# Patient Record
Sex: Male | Born: 1965
Health system: Southern US, Community
[De-identification: ages and names within clinical notes are randomized; demographics above are authoritative.]

## PROBLEM LIST (undated history)

## (undated) DIAGNOSIS — G4730 Sleep apnea, unspecified: Secondary | ICD-10-CM

## (undated) DIAGNOSIS — K4090 Unilateral inguinal hernia, without obstruction or gangrene, not specified as recurrent: Secondary | ICD-10-CM

## (undated) DIAGNOSIS — J302 Other seasonal allergic rhinitis: Secondary | ICD-10-CM

## (undated) DIAGNOSIS — Z9889 Other specified postprocedural states: Secondary | ICD-10-CM

## (undated) DIAGNOSIS — R112 Nausea with vomiting, unspecified: Secondary | ICD-10-CM

---
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
## 2003-12-06 HISTORY — PX: OTHER SURGICAL HISTORY: SHX169

## 2007-01-09 ENCOUNTER — Encounter: Admission: RE | Admit: 2007-01-09 | Discharge: 2007-01-09 | Payer: Self-pay | Admitting: Otolaryngology

## 2008-07-20 IMAGING — CT CT NECK W/ CM
3 of 4 series · 11 of 20 positions shown, 12 images · IV contrast ([ID] OMNI 300)
Comparison: none

CLINICAL DATA: Possible right-sided abnormality.  Area of question marked with a BB.  
NECK CT WITH CONTRAST:
TECHNIQUE: Multidetector CT imaging of the neck was performed following the standard protocol during administration of intravenous contrast.
Contrast:  100 cc Omnipaque 300

[Series 2: neck w/ · axial · 0.39mm/px · z∈[+28,+186]mm · 4 of 71 slices shown, 5 images]
[im 15/71  soft-tissue]
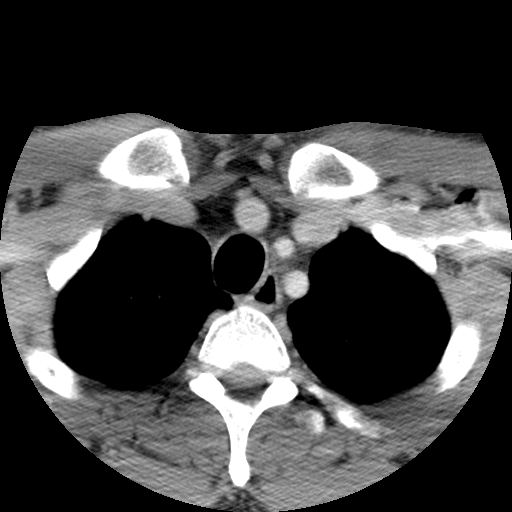
[im 15/71  bone]
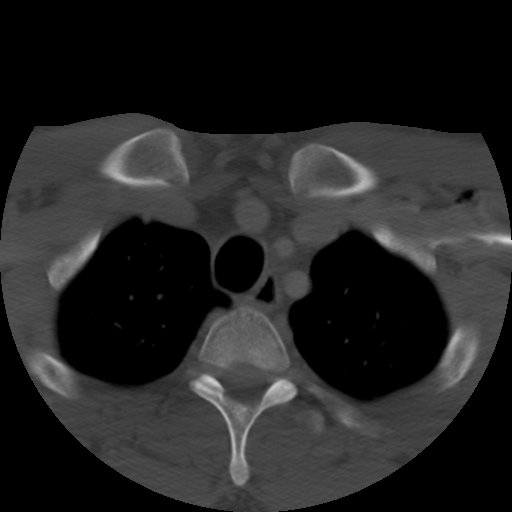
[im 29/71  bone]
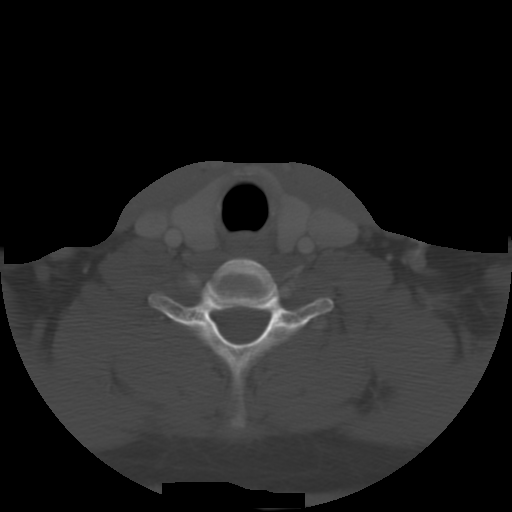
[im 43/71  bone]
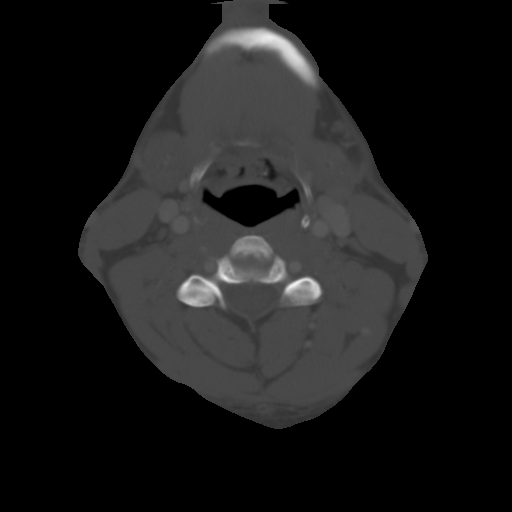
[im 57/71  bone]
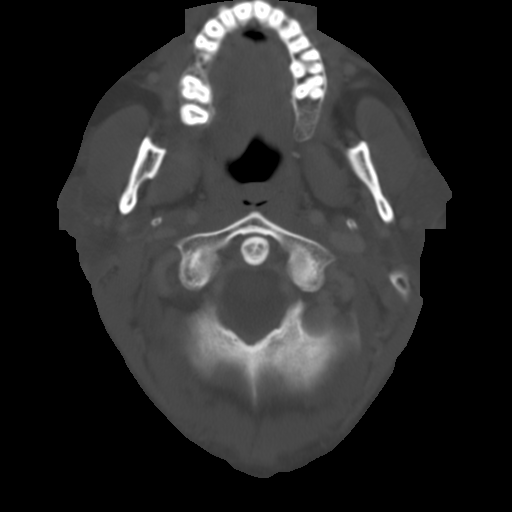

[Series 400: sagittal · sagittal · 0.53mm/px · 4 of 69 slices shown]
[im 14/69  bone]
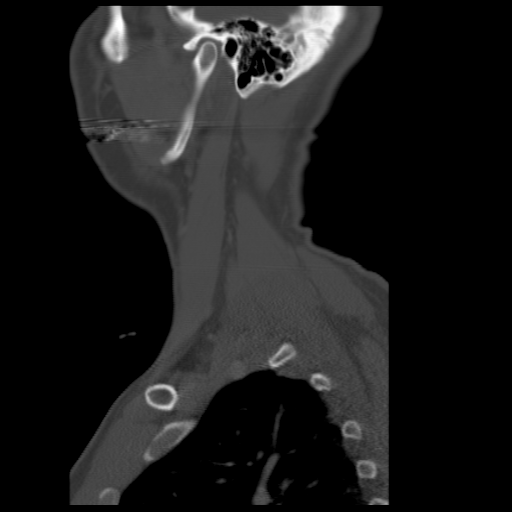
[im 28/69  bone]
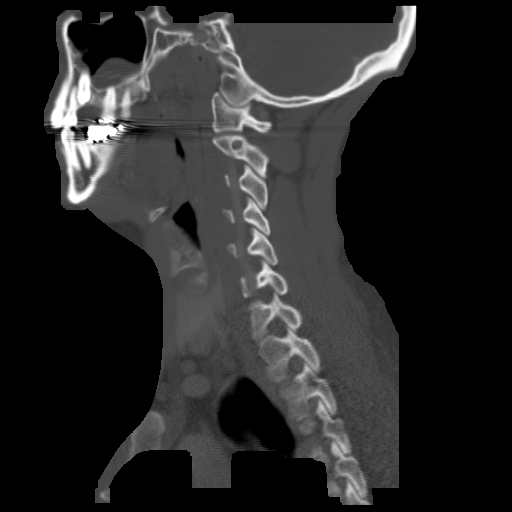
[im 41/69  bone]
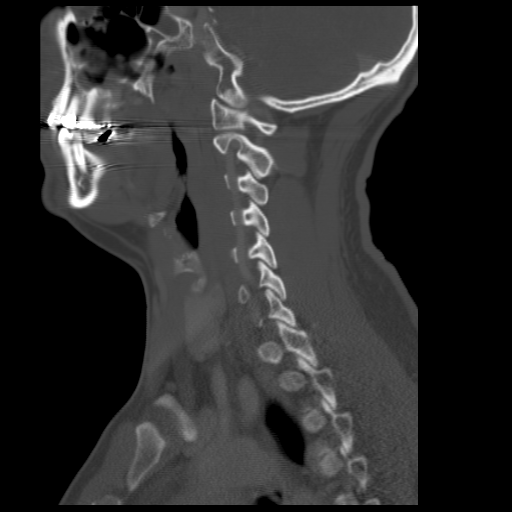
[im 55/69  bone]
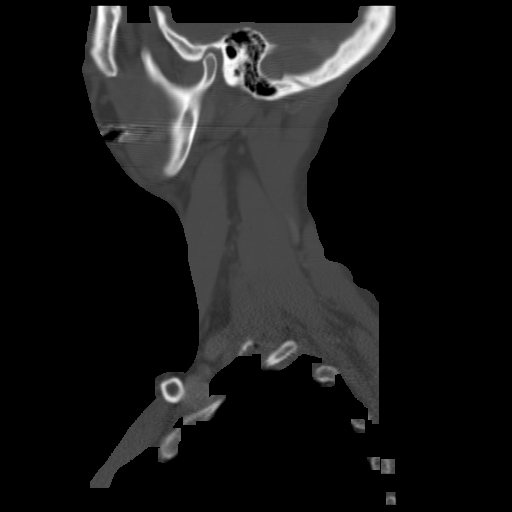

[Series 401: coronal · coronal · 0.53mm/px · 3 of 69 slices shown]
[im 14/69  bone]
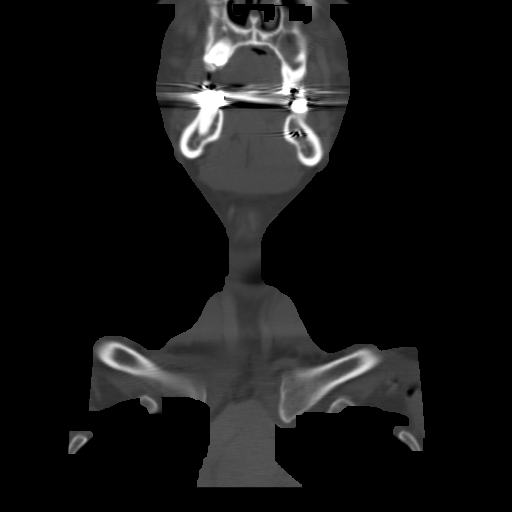
[im 28/69  bone]
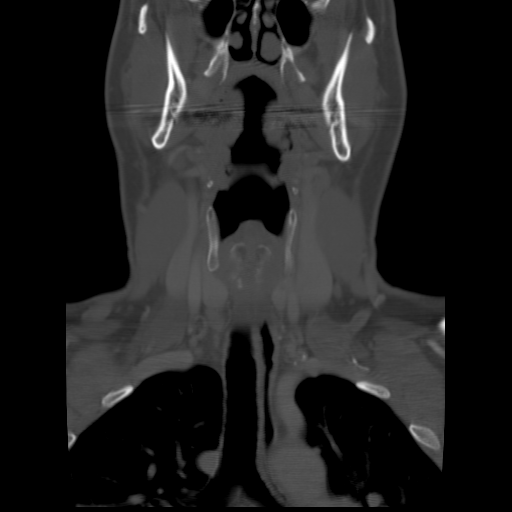
[im 41/69  bone]
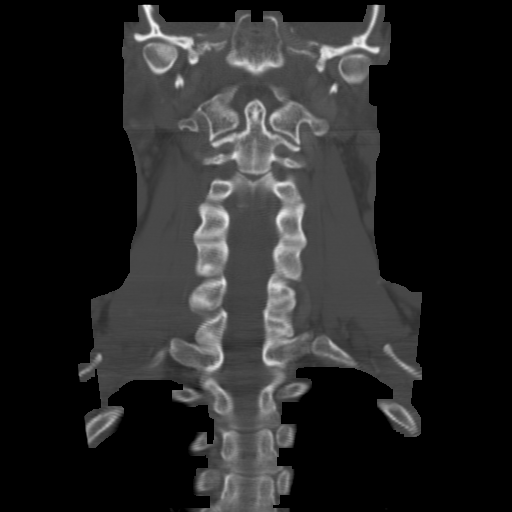

[11 of 20 positions shown; findings below may reference images not displayed]

FINDINGS: Considerable Hounsfield artifact is seen over the region of the parotids due to dental amalgam.  The study is suboptimal.  There is no patient motion.  No adenopathy is present in the neck.  The palpable abnormality appears to be a small cystic lesion in the superficial lobe of the right parotid.  Cross sectional measurements roughly of 6 x 7 x 7 mm.  There are no similar lesions on the left.  I see no calculi.  I do not see a definite solid enhancing component but again the image quality is reduced in this area due to artifact.  Submandibular glands are normal.  No mucosal lesions are seen.  Airway widely patent.  No skull base abnormalities present.  Thyroid gland normal.  No laryngeal abnormality.  Carotid and vertebral arteries widely patent.  Visualized portions of the intracranial compartment unremarkable. 
I think it is likely that this represents an incidental right parotid gland cyst.  It is difficult to be absolutely sure however because of the streak artifact from the dental amalgam.  For this reason I would consider followup CT of the parotid area with gantry angulation to avoid dental artifact in 6 months for confirmation of interval stability.. 
No osseous abnormalities are detected.
IMPRESSION: Subcentimeter cystic lesion right parotid gland unfortunately lying at the same level as dental amalgam streak artifact; most likely represents a benign incidental finding.  I would consider followup CT scan in 6 months.  See comments above.

## 2010-12-05 HISTORY — PX: OTHER SURGICAL HISTORY: SHX169

## 2011-12-06 HISTORY — PX: LAPAROSCOPIC CHOLECYSTECTOMY: SUR755

## 2015-08-20 DIAGNOSIS — J988 Other specified respiratory disorders: Secondary | ICD-10-CM | POA: Insufficient documentation

## 2015-08-20 DIAGNOSIS — H1010 Acute atopic conjunctivitis, unspecified eye: Secondary | ICD-10-CM | POA: Insufficient documentation

## 2015-08-20 DIAGNOSIS — R197 Diarrhea, unspecified: Secondary | ICD-10-CM

## 2015-08-20 DIAGNOSIS — J309 Allergic rhinitis, unspecified: Principal | ICD-10-CM

## 2015-11-18 ENCOUNTER — Other Ambulatory Visit: Payer: Self-pay | Admitting: Allergy and Immunology

## 2016-04-05 ENCOUNTER — Other Ambulatory Visit: Payer: Self-pay | Admitting: Allergy and Immunology

## 2016-07-10 ENCOUNTER — Other Ambulatory Visit: Payer: Self-pay | Admitting: Allergy and Immunology

## 2016-08-08 ENCOUNTER — Other Ambulatory Visit: Payer: Self-pay | Admitting: Allergy and Immunology

## 2018-01-16 DIAGNOSIS — J029 Acute pharyngitis, unspecified: Secondary | ICD-10-CM | POA: Diagnosis not present

## 2018-01-16 DIAGNOSIS — J00 Acute nasopharyngitis [common cold]: Secondary | ICD-10-CM | POA: Diagnosis not present

## 2018-07-16 DIAGNOSIS — J0140 Acute pansinusitis, unspecified: Secondary | ICD-10-CM | POA: Diagnosis not present

## 2018-07-26 DIAGNOSIS — J019 Acute sinusitis, unspecified: Secondary | ICD-10-CM | POA: Diagnosis not present

## 2018-09-17 DIAGNOSIS — Z23 Encounter for immunization: Secondary | ICD-10-CM | POA: Diagnosis not present

## 2018-10-31 ENCOUNTER — Other Ambulatory Visit: Payer: Self-pay | Admitting: Internal Medicine

## 2018-10-31 DIAGNOSIS — D696 Thrombocytopenia, unspecified: Secondary | ICD-10-CM

## 2018-10-31 DIAGNOSIS — R748 Abnormal levels of other serum enzymes: Secondary | ICD-10-CM

## 2019-01-16 DIAGNOSIS — J069 Acute upper respiratory infection, unspecified: Secondary | ICD-10-CM | POA: Diagnosis not present

## 2019-01-31 DIAGNOSIS — J310 Chronic rhinitis: Secondary | ICD-10-CM | POA: Diagnosis not present

## 2019-01-31 DIAGNOSIS — J3489 Other specified disorders of nose and nasal sinuses: Secondary | ICD-10-CM | POA: Diagnosis not present

## 2019-01-31 DIAGNOSIS — J343 Hypertrophy of nasal turbinates: Secondary | ICD-10-CM | POA: Diagnosis not present

## 2019-01-31 DIAGNOSIS — J324 Chronic pansinusitis: Secondary | ICD-10-CM | POA: Diagnosis not present

## 2019-01-31 DIAGNOSIS — J328 Other chronic sinusitis: Secondary | ICD-10-CM | POA: Diagnosis not present

## 2019-02-04 DIAGNOSIS — Z0001 Encounter for general adult medical examination with abnormal findings: Secondary | ICD-10-CM | POA: Diagnosis not present

## 2019-02-04 DIAGNOSIS — Z1211 Encounter for screening for malignant neoplasm of colon: Secondary | ICD-10-CM | POA: Diagnosis not present

## 2019-02-04 DIAGNOSIS — M25562 Pain in left knee: Secondary | ICD-10-CM | POA: Diagnosis not present

## 2019-02-04 DIAGNOSIS — Z13220 Encounter for screening for lipoid disorders: Secondary | ICD-10-CM | POA: Diagnosis not present

## 2019-04-05 HISTORY — PX: NASAL SINUS SURGERY: SHX719

## 2019-04-22 DIAGNOSIS — Z1159 Encounter for screening for other viral diseases: Secondary | ICD-10-CM | POA: Diagnosis not present

## 2019-04-25 DIAGNOSIS — J322 Chronic ethmoidal sinusitis: Secondary | ICD-10-CM | POA: Diagnosis not present

## 2019-04-25 DIAGNOSIS — J320 Chronic maxillary sinusitis: Secondary | ICD-10-CM | POA: Diagnosis not present

## 2019-04-25 DIAGNOSIS — J343 Hypertrophy of nasal turbinates: Secondary | ICD-10-CM | POA: Diagnosis not present

## 2019-04-25 DIAGNOSIS — J321 Chronic frontal sinusitis: Secondary | ICD-10-CM | POA: Diagnosis not present

## 2021-01-15 DIAGNOSIS — Z8616 Personal history of COVID-19: Secondary | ICD-10-CM

## 2021-01-15 HISTORY — DX: Personal history of COVID-19: Z86.16

## 2022-01-14 ENCOUNTER — Ambulatory Visit: Payer: Self-pay | Admitting: Surgery

## 2022-01-14 NOTE — H&P (Signed)
° °  Robert Blake [id number]   Referring Provider:  Ramond Dial, PA   Subjective   Chief Complaint: Inguinal Hernia     History of Present Illness:    This is a very pleasant and relatively healthy 56 year old man who presents with a right inguinal hernia.  He first noticed this about 3 weeks ago when he developed pain and a protrusion in the right groin.  This is reducible when he is supine.  It remains quite uncomfortable especially with certain movements or wearing a seatbelt in the car.  Denies any associated GI symptoms or bladder symptoms.  No previous abdominal surgery.  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  He does do some heavy lifting on the weekends. Denies any tobacco use.  Review of Systems: A complete review of systems was obtained from the patient.  I have reviewed this information and discussed as appropriate with the patient.  See HPI as well for other ROS.   Medical History: History reviewed. No pertinent past medical history.  There is no problem list on file for this patient.   Past Surgical History:  Procedure Laterality Date   APPENDECTOMY     CHOLECYSTECTOMY     FUNCTIONAL ENDOSCOPIC SINUS SURGERY     2021   JOINT REPLACEMENT       No Known Allergies  Current Outpatient Medications on File Prior to Visit  Medication Sig Dispense Refill   cetirizine (ZYRTEC) 10 MG tablet Take 10 mg by mouth once daily     loratadine (CLARITIN) 10 mg tablet Take 10 mg by mouth once daily     pseudoephedrine (SUDAFED) 30 mg tablet Take by mouth     No current facility-administered medications on file prior to visit.    Family History  Problem Relation Age of Onset   Colon cancer Mother    Stroke Father    Coronary Artery Disease (Blocked arteries around heart) Father      Social History   Tobacco Use  Smoking Status Never  Smokeless Tobacco Never     Social History    Socioeconomic History   Marital status: Unknown  Tobacco Use   Smoking status: Never   Smokeless tobacco: Never  Substance and Sexual Activity   Alcohol use: Yes   Drug use: Not Currently    Objective:    Vitals:   01/14/22 1427  BP: 130/80  Pulse: 86  Weight: 100.1 kg (220 lb 9.6 oz)  Height: 175.3 cm (5\' 9" )    Body mass index is 32.58 kg/m.  Alert and well-appearing Unlabored respirations Abdomen soft nontender, reducible right inguinal hernia without overlying skin change  Assessment and Plan:  Diagnoses and all orders for this visit:  Non-recurrent unilateral inguinal hernia without obstruction or gangrene  I recommend open repair. We discussed the relevant anatomy and we discussed the technique of the procedure.  Discussed risks of bleeding, infection, pain, scarring, injury to structures in the area including nerves, blood vessels, bowel, bladder, risk of chronic pain, hernia recurrence, risk of seroma or hematoma, urinary retention, and risks of general anesthesia including cardiovascular, pulmonary, and thromboembolic complications.  Questions were answered.  Patient wishes to proceed with scheduling as soon as possible.Bobbe Medico, MD

## 2022-01-14 NOTE — H&P (View-Only) (Signed)
° °  Robert Blake [id number]   Referring Provider:  Ramond Dial, PA   Subjective   Chief Complaint: Inguinal Hernia     History of Present Illness:    This is a very pleasant and relatively healthy 56 year old man who presents with a right inguinal hernia.  He first noticed this about 3 weeks ago when he developed pain and a protrusion in the right groin.  This is reducible when he is supine.  It remains quite uncomfortable especially with certain movements or wearing a seatbelt in the car.  Denies any associated GI symptoms or bladder symptoms.  No previous abdominal surgery.  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  He does do some heavy lifting on the weekends. Denies any tobacco use.  Review of Systems: A complete review of systems was obtained from the patient.  I have reviewed this information and discussed as appropriate with the patient.  See HPI as well for other ROS.   Medical History: History reviewed. No pertinent past medical history.  There is no problem list on file for this patient.   Past Surgical History:  Procedure Laterality Date   APPENDECTOMY     CHOLECYSTECTOMY     FUNCTIONAL ENDOSCOPIC SINUS SURGERY     2021   JOINT REPLACEMENT       No Known Allergies  Current Outpatient Medications on File Prior to Visit  Medication Sig Dispense Refill   cetirizine (ZYRTEC) 10 MG tablet Take 10 mg by mouth once daily     loratadine (CLARITIN) 10 mg tablet Take 10 mg by mouth once daily     pseudoephedrine (SUDAFED) 30 mg tablet Take by mouth     No current facility-administered medications on file prior to visit.    Family History  Problem Relation Age of Onset   Colon cancer Mother    Stroke Father    Coronary Artery Disease (Blocked arteries around heart) Father      Social History   Tobacco Use  Smoking Status Never  Smokeless Tobacco Never     Social History    Socioeconomic History   Marital status: Unknown  Tobacco Use   Smoking status: Never   Smokeless tobacco: Never  Substance and Sexual Activity   Alcohol use: Yes   Drug use: Not Currently    Objective:    Vitals:   01/14/22 1427  BP: 130/80  Pulse: 86  Weight: 100.1 kg (220 lb 9.6 oz)  Height: 175.3 cm (5\' 9" )    Body mass index is 32.58 kg/m.  Alert and well-appearing Unlabored respirations Abdomen soft nontender, reducible right inguinal hernia without overlying skin change  Assessment and Plan:  Diagnoses and all orders for this visit:  Non-recurrent unilateral inguinal hernia without obstruction or gangrene  I recommend open repair. We discussed the relevant anatomy and we discussed the technique of the procedure.  Discussed risks of bleeding, infection, pain, scarring, injury to structures in the area including nerves, blood vessels, bowel, bladder, risk of chronic pain, hernia recurrence, risk of seroma or hematoma, urinary retention, and risks of general anesthesia including cardiovascular, pulmonary, and thromboembolic complications.  Questions were answered.  Patient wishes to proceed with scheduling as soon as possible.Bobbe Medico, MD

## 2022-01-19 ENCOUNTER — Encounter (HOSPITAL_BASED_OUTPATIENT_CLINIC_OR_DEPARTMENT_OTHER): Payer: Self-pay | Admitting: Surgery

## 2022-01-21 ENCOUNTER — Other Ambulatory Visit: Payer: Self-pay

## 2022-01-21 ENCOUNTER — Encounter (HOSPITAL_BASED_OUTPATIENT_CLINIC_OR_DEPARTMENT_OTHER): Payer: Self-pay | Admitting: Surgery

## 2022-01-21 DIAGNOSIS — K4090 Unilateral inguinal hernia, without obstruction or gangrene, not specified as recurrent: Secondary | ICD-10-CM

## 2022-01-21 HISTORY — DX: Unilateral inguinal hernia, without obstruction or gangrene, not specified as recurrent: K40.90

## 2022-01-21 NOTE — Progress Notes (Signed)
Spoke w/ via phone for pre-op interview---pt Lab needs dos----    none           Lab results------none COVID test -----patient states asymptomatic no test needed Arrive at -------645 am 01-25-2022 NPO after MN NO Solid Food.  Clear liquids from MN until---545 am Med rec completed Medications to take morning of surgery -----Claritin Diabetic medication -----n/a Patient instructed no nail polish to be worn day of surgery Patient instructed to bring photo id and insurance card day of surgery Patient aware to have Driver (ride ) / caregiver   wife Gregary Signs will stay cell [phone]  for 24 hours after surgery  Patient Special Instructions -----none Pre-Op special Istructions -----none Patient verbalized understanding of instructions that were given at this phone interview. Patient denies shortness of breath, chest pain, fever, cough at this phone interview.

## 2022-01-25 ENCOUNTER — Ambulatory Visit (HOSPITAL_BASED_OUTPATIENT_CLINIC_OR_DEPARTMENT_OTHER): Payer: 59 | Admitting: Certified Registered"

## 2022-01-25 ENCOUNTER — Ambulatory Visit (HOSPITAL_BASED_OUTPATIENT_CLINIC_OR_DEPARTMENT_OTHER)
Admission: RE | Admit: 2022-01-25 | Discharge: 2022-01-25 | Disposition: A | Discharge status: Home / Self Care | Payer: 59 | Source: Ambulatory Visit | Attending: Surgery | Admitting: Surgery

## 2022-01-25 ENCOUNTER — Encounter (HOSPITAL_BASED_OUTPATIENT_CLINIC_OR_DEPARTMENT_OTHER)
Admission: RE | Disposition: A | Discharge status: Home / Self Care | Payer: Self-pay | Source: Ambulatory Visit | Attending: Surgery

## 2022-01-25 ENCOUNTER — Encounter (HOSPITAL_BASED_OUTPATIENT_CLINIC_OR_DEPARTMENT_OTHER): Payer: Self-pay | Admitting: Surgery

## 2022-01-25 ENCOUNTER — Other Ambulatory Visit: Payer: Self-pay

## 2022-01-25 DIAGNOSIS — K4090 Unilateral inguinal hernia, without obstruction or gangrene, not specified as recurrent: Secondary | ICD-10-CM | POA: Insufficient documentation

## 2022-01-25 DIAGNOSIS — D176 Benign lipomatous neoplasm of spermatic cord: Secondary | ICD-10-CM | POA: Insufficient documentation

## 2022-01-25 HISTORY — DX: Unilateral inguinal hernia, without obstruction or gangrene, not specified as recurrent: K40.90

## 2022-01-25 HISTORY — DX: Nausea with vomiting, unspecified: R11.2

## 2022-01-25 HISTORY — PX: INGUINAL HERNIA REPAIR: SHX194

## 2022-01-25 HISTORY — DX: Nausea with vomiting, unspecified: Z98.890

## 2022-01-25 HISTORY — DX: Sleep apnea, unspecified: G47.30

## 2022-01-25 HISTORY — DX: Other seasonal allergic rhinitis: J30.2

## 2022-01-25 SURGERY — REPAIR, HERNIA, INGUINAL, ADULT
Anesthesia: General | Site: Abdomen | Laterality: Right

## 2022-01-25 MED ORDER — ACETAMINOPHEN 325 MG PO TABS: 650.0000 mg | ORAL_TABLET | ORAL | Status: DC | PRN

## 2022-01-25 MED ORDER — PROPOFOL 10 MG/ML IV BOLUS
INTRAVENOUS | Status: DC | PRN
  Administered 2022-01-25: 200 mg via INTRAVENOUS

## 2022-01-25 MED ORDER — BUPIVACAINE LIPOSOME 1.3 % IJ SUSP
INTRAMUSCULAR | Status: DC | PRN
  Administered 2022-01-25: 20 mL

## 2022-01-25 MED ORDER — GABAPENTIN 300 MG PO CAPS
300.0000 mg | ORAL_CAPSULE | ORAL | Status: AC
  Administered 2022-01-25: 300 mg via ORAL

## 2022-01-25 MED ORDER — DEXAMETHASONE SODIUM PHOSPHATE 10 MG/ML IJ SOLN
INTRAMUSCULAR | Status: AC
  Filled 2022-01-25: qty 1

## 2022-01-25 MED ORDER — ACETAMINOPHEN 500 MG PO TABS
1000.0000 mg | ORAL_TABLET | ORAL | Status: AC
  Administered 2022-01-25: 1000 mg via ORAL

## 2022-01-25 MED ORDER — LIDOCAINE HCL (PF) 2 % IJ SOLN
INTRAMUSCULAR | Status: AC
  Filled 2022-01-25: qty 5

## 2022-01-25 MED ORDER — DROPERIDOL 2.5 MG/ML IJ SOLN
INTRAMUSCULAR | Status: DC | PRN
  Administered 2022-01-25: .625 mg via INTRAVENOUS

## 2022-01-25 MED ORDER — EPHEDRINE 5 MG/ML INJ
INTRAVENOUS | Status: AC
Start: 2022-01-25 — End: ?
  Filled 2022-01-25: qty 5

## 2022-01-25 MED ORDER — BUPIVACAINE-EPINEPHRINE 0.25% -1:200000 IJ SOLN
INTRAMUSCULAR | Status: DC | PRN
  Administered 2022-01-25: 30 mL

## 2022-01-25 MED ORDER — ONDANSETRON HCL 4 MG/2ML IJ SOLN
INTRAMUSCULAR | Status: AC
  Filled 2022-01-25: qty 2

## 2022-01-25 MED ORDER — CHLORHEXIDINE GLUCONATE 4 % EX LIQD: 60.0000 mL | Freq: Once | CUTANEOUS | Status: DC

## 2022-01-25 MED ORDER — OXYCODONE HCL 5 MG PO TABS: 5.0000 mg | ORAL_TABLET | ORAL | Status: DC | PRN

## 2022-01-25 MED ORDER — 0.9 % SODIUM CHLORIDE (POUR BTL) OPTIME
TOPICAL | Status: DC | PRN
  Administered 2022-01-25: 500 mL

## 2022-01-25 MED ORDER — FENTANYL CITRATE (PF) 100 MCG/2ML IJ SOLN
INTRAMUSCULAR | Status: AC
  Filled 2022-01-25: qty 2

## 2022-01-25 MED ORDER — CEFAZOLIN SODIUM-DEXTROSE 2-4 GM/100ML-% IV SOLN
2.0000 g | INTRAVENOUS | Status: AC
  Administered 2022-01-25: 2 g via INTRAVENOUS

## 2022-01-25 MED ORDER — KETOROLAC TROMETHAMINE 30 MG/ML IJ SOLN: 30.0000 mg | Freq: Once | INTRAMUSCULAR | Status: DC | PRN

## 2022-01-25 MED ORDER — LIDOCAINE 2% (20 MG/ML) 5 ML SYRINGE
INTRAMUSCULAR | Status: DC | PRN
  Administered 2022-01-25: 100 mg via INTRAVENOUS

## 2022-01-25 MED ORDER — DOCUSATE SODIUM 100 MG PO CAPS: 100.0000 mg | ORAL_CAPSULE | Freq: Two times a day (BID) | ORAL | 0 refills | Status: AC

## 2022-01-25 MED ORDER — OXYCODONE HCL 5 MG PO TABS: 5.0000 mg | ORAL_TABLET | Freq: Once | ORAL | Status: DC | PRN

## 2022-01-25 MED ORDER — LACTATED RINGERS IV SOLN: INTRAVENOUS | Status: DC

## 2022-01-25 MED ORDER — FENTANYL CITRATE (PF) 100 MCG/2ML IJ SOLN: 25.0000 ug | INTRAMUSCULAR | Status: DC | PRN

## 2022-01-25 MED ORDER — ACETAMINOPHEN 500 MG PO TABS
ORAL_TABLET | ORAL | Status: AC
  Filled 2022-01-25: qty 2

## 2022-01-25 MED ORDER — ACETAMINOPHEN 325 MG RE SUPP: 650.0000 mg | RECTAL | Status: DC | PRN

## 2022-01-25 MED ORDER — OXYCODONE HCL 5 MG/5ML PO SOLN: 5.0000 mg | Freq: Once | ORAL | Status: DC | PRN

## 2022-01-25 MED ORDER — MIDAZOLAM HCL 2 MG/2ML IJ SOLN
INTRAMUSCULAR | Status: DC | PRN
  Administered 2022-01-25: 2 mg via INTRAVENOUS

## 2022-01-25 MED ORDER — ONDANSETRON HCL 4 MG/2ML IJ SOLN
INTRAMUSCULAR | Status: DC | PRN
  Administered 2022-01-25: 4 mg via INTRAVENOUS

## 2022-01-25 MED ORDER — SCOPOLAMINE 1 MG/3DAYS TD PT72
MEDICATED_PATCH | TRANSDERMAL | Status: DC | PRN
  Administered 2022-01-25: 1 via TRANSDERMAL

## 2022-01-25 MED ORDER — HYDROMORPHONE HCL 1 MG/ML IJ SOLN: 0.2500 mg | INTRAMUSCULAR | Status: DC | PRN

## 2022-01-25 MED ORDER — DEXAMETHASONE SODIUM PHOSPHATE 10 MG/ML IJ SOLN
INTRAMUSCULAR | Status: DC | PRN
  Administered 2022-01-25 (×2): 5 mg via INTRAVENOUS

## 2022-01-25 MED ORDER — ROCURONIUM BROMIDE 10 MG/ML (PF) SYRINGE
PREFILLED_SYRINGE | INTRAVENOUS | Status: AC
  Filled 2022-01-25: qty 10

## 2022-01-25 MED ORDER — MIDAZOLAM HCL 2 MG/2ML IJ SOLN
INTRAMUSCULAR | Status: AC
  Filled 2022-01-25: qty 2

## 2022-01-25 MED ORDER — EPHEDRINE SULFATE-NACL 50-0.9 MG/10ML-% IV SOSY
PREFILLED_SYRINGE | INTRAVENOUS | Status: DC | PRN
  Administered 2022-01-25 (×3): 10 mg via INTRAVENOUS

## 2022-01-25 MED ORDER — PROPOFOL 10 MG/ML IV BOLUS
INTRAVENOUS | Status: AC
  Filled 2022-01-25: qty 20

## 2022-01-25 MED ORDER — CEFAZOLIN SODIUM-DEXTROSE 2-4 GM/100ML-% IV SOLN
INTRAVENOUS | Status: AC
  Filled 2022-01-25: qty 100

## 2022-01-25 MED ORDER — SODIUM CHLORIDE 0.9 % IV SOLN: 250.0000 mL | INTRAVENOUS | Status: DC | PRN

## 2022-01-25 MED ORDER — OXYCODONE HCL 5 MG PO TABS: 5.0000 mg | ORAL_TABLET | Freq: Three times a day (TID) | ORAL | 0 refills | Status: AC | PRN

## 2022-01-25 MED ORDER — BUPIVACAINE LIPOSOME 1.3 % IJ SUSP: 20.0000 mL | Freq: Once | INTRAMUSCULAR | Status: DC

## 2022-01-25 MED ORDER — SODIUM CHLORIDE 0.9% FLUSH: 3.0000 mL | Freq: Two times a day (BID) | INTRAVENOUS | Status: DC

## 2022-01-25 MED ORDER — PROMETHAZINE HCL 25 MG/ML IJ SOLN: 6.2500 mg | INTRAMUSCULAR | Status: DC | PRN

## 2022-01-25 MED ORDER — SODIUM CHLORIDE 0.9% FLUSH: 3.0000 mL | INTRAVENOUS | Status: DC | PRN

## 2022-01-25 MED ORDER — FENTANYL CITRATE (PF) 100 MCG/2ML IJ SOLN
INTRAMUSCULAR | Status: DC | PRN
Start: 2022-01-25 — End: 2022-01-25
  Administered 2022-01-25 (×2): 50 ug via INTRAVENOUS

## 2022-01-25 MED ORDER — GABAPENTIN 300 MG PO CAPS
ORAL_CAPSULE | ORAL | Status: AC
  Filled 2022-01-25: qty 1

## 2022-01-25 MED ORDER — PHENYLEPHRINE 40 MCG/ML (10ML) SYRINGE FOR IV PUSH (FOR BLOOD PRESSURE SUPPORT)
PREFILLED_SYRINGE | INTRAVENOUS | Status: AC
  Filled 2022-01-25: qty 10

## 2022-01-25 SURGICAL SUPPLY — 44 items
ADH SKN CLS APL DERMABOND .7 (GAUZE/BANDAGES/DRESSINGS) ×1
APL PRP STRL LF DISP 70% ISPRP (MISCELLANEOUS) ×1
APL SKNCLS STERI-STRIP NONHPOA (GAUZE/BANDAGES/DRESSINGS) ×1
BENZOIN TINCTURE PRP APPL 2/3 (GAUZE/BANDAGES/DRESSINGS) ×2 IMPLANT
BLADE SURG 15 STRL LF DISP TIS (BLADE) ×2 IMPLANT
BLADE SURG 15 STRL SS (BLADE) ×4
CHLORAPREP W/TINT 26 (MISCELLANEOUS) ×2 IMPLANT
COVER BACK TABLE 60X90IN (DRAPES) ×2 IMPLANT
COVER MAYO STAND STRL (DRAPES) ×2 IMPLANT
DECANTER SPIKE VIAL GLASS SM (MISCELLANEOUS) IMPLANT
DERMABOND ADVANCED (GAUZE/BANDAGES/DRESSINGS) ×1
DERMABOND ADVANCED .7 DNX12 (GAUZE/BANDAGES/DRESSINGS) IMPLANT
DRAIN PENROSE 0.5X18 (DRAIN) ×2 IMPLANT
DRAPE LAPAROTOMY TRNSV 102X78 (DRAPES) ×2 IMPLANT
DRAPE UTILITY XL STRL (DRAPES) ×2 IMPLANT
GAUZE 4X4 16PLY ~~LOC~~+RFID DBL (SPONGE) ×2 IMPLANT
GAUZE SPONGE 4X4 12PLY STRL (GAUZE/BANDAGES/DRESSINGS) ×2 IMPLANT
GLOVE SURG ENC MOIS LTX SZ6 (GLOVE) ×2 IMPLANT
GLOVE SURG UNDER POLY LF SZ6.5 (GLOVE) ×2 IMPLANT
GOWN STRL REUS W/TWL LRG LVL3 (GOWN DISPOSABLE) ×2 IMPLANT
KIT TURNOVER CYSTO (KITS) ×2 IMPLANT
MESH ULTRAPRO 3X6 7.6X15CM (Mesh General) ×1 IMPLANT
NEEDLE HYPO 22GX1.5 SAFETY (NEEDLE) ×2 IMPLANT
PACK BASIN DAY SURGERY FS (CUSTOM PROCEDURE TRAY) ×2 IMPLANT
PENCIL SMOKE EVACUATOR (MISCELLANEOUS) ×2 IMPLANT
SPONGE T-LAP 4X18 ~~LOC~~+RFID (SPONGE) ×2 IMPLANT
STRIP CLOSURE SKIN 1/2X4 (GAUZE/BANDAGES/DRESSINGS) ×2 IMPLANT
SUT ETHIBOND 0 (SUTURE) IMPLANT
SUT ETHIBOND 0 MO6 C/R (SUTURE) ×2 IMPLANT
SUT MNCRL AB 4-0 PS2 18 (SUTURE) ×2 IMPLANT
SUT PDS AB 2-0 CT2 27 (SUTURE) ×2 IMPLANT
SUT PROLENE 2 0 CT2 30 (SUTURE) IMPLANT
SUT VIC AB 0 CT1 27 (SUTURE) ×2
SUT VIC AB 0 CT1 27XBRD ANBCTR (SUTURE) ×1 IMPLANT
SUT VIC AB 3-0 SH 27 (SUTURE) ×4
SUT VIC AB 3-0 SH 27X BRD (SUTURE) ×2 IMPLANT
SUT VICRYL AB 3 0 TIES (SUTURE) ×2 IMPLANT
SYR 10ML LL (SYRINGE) ×2 IMPLANT
SYR BULB IRRIG 60ML STRL (SYRINGE) ×2 IMPLANT
SYR CONTROL 10ML LL (SYRINGE) ×2 IMPLANT
TOWEL OR 17X26 10 PK STRL BLUE (TOWEL DISPOSABLE) ×4 IMPLANT
TRAY FOL W/BAG SLVR 16FR STRL (SET/KITS/TRAYS/PACK) ×1 IMPLANT
TRAY FOLEY W/BAG SLVR 16FR LF (SET/KITS/TRAYS/PACK) ×2
YANKAUER SUCT BULB TIP NO VENT (SUCTIONS) ×1 IMPLANT

## 2022-01-25 NOTE — Anesthesia Procedure Notes (Signed)
Procedure Name: LMA Insertion Date/Time: 01/25/2022 8:42 AM Performed by: Suan Halter, CRNA Pre-anesthesia Checklist: Patient identified, Emergency Drugs available, Suction available and Patient being monitored Patient Re-evaluated:Patient Re-evaluated prior to induction Oxygen Delivery Method: Circle system utilized Preoxygenation: Pre-oxygenation with 100% oxygen Induction Type: IV induction Ventilation: Mask ventilation without difficulty LMA: LMA inserted LMA Size: 4.0 Number of attempts: 1 Airway Equipment and Method: Bite block Placement Confirmation: positive ETCO2 Tube secured with: Tape Dental Injury: Teeth and Oropharynx as per pre-operative assessment

## 2022-01-25 NOTE — Anesthesia Postprocedure Evaluation (Signed)
Anesthesia Post Note  Patient: Deddrick Lipsett  Procedure(s) Performed: OPEN RIGHT INGUINAL HERNIA REPAIR with mesh (Right: Abdomen)     Patient location during evaluation: PACU Anesthesia Type: General Level of consciousness: awake and alert Pain management: pain level controlled Vital Signs Assessment: post-procedure vital signs reviewed and stable Respiratory status: spontaneous breathing, nonlabored ventilation, respiratory function stable and patient connected to nasal cannula oxygen Cardiovascular status: blood pressure returned to baseline and stable Postop Assessment: no apparent nausea or vomiting Anesthetic complications: no   No notable events documented.  Last Vitals:  Vitals:   01/25/22 1030 01/25/22 1115  BP: 110/81 117/84  Pulse: 67 65  Resp: 13 14  Temp:  36.4 C  SpO2: 99% 97%    Last Pain:  Vitals:   01/25/22 1115  TempSrc:   PainSc: 2                  Upton Russey S

## 2022-01-25 NOTE — Op Note (Signed)
Operative Note  Robert Blake  [id number]  [id number]  01/25/2022   Surgeon: Clovis Riley MD FACS   Procedure performed: Open inguinal hernia repair with UltraPro mesh: right, indirect   Preop diagnosis:  right inguinal hernia   Post-op diagnosis/intraop findings: indirect inguinal hernia   Specimens: none   EBL: 5cc   Complications: none   Description of procedure: After obtaining informed consent the patient was taken to the operating room and placed supine on operating room table where general anesthesia was initiated, preoperative antibiotics were administered, SCDs applied, and a formal timeout was performed. Foley catheter was inserted which was removed at the end of the case. The groin was clipped, prepped and draped in the usual sterile fashion. An oblique incision was made in the just above the inguinal ligament after infiltrating the tissues with local anesthetic (exparel mixed with 0.25% marcaine with epi). Soft tissues were dissected using electrocautery until the external oblique aponeurosis was encountered. This was divided sharply to expand the external ring. A plane was bluntly developed between the spermatic cord and the external oblique. The ilioinguinal nerve was divided between hemostats and each end ligated with 3-0 vicryl ties. The spermatic cord was then bluntly dissected away from the pubic tubercle and encircled with a Penrose. Inspection of the inguinal anatomy revealed a moderate indirect sac as well as a cord lipoma. The lipoma was ligated at the level of the internal ring with a 3-0 vicryl tie. The indirect hernia sac was bluntly dissected away from the cord structures. This was reduced intact into the abdominal cavity.  The internal ring was narrowed and inguinal floor reapproximated with 2-0 PDS interrupted. A 3 x 6 piece of ultra Pro mesh was brought onto the field and trimmed to approximate the field. This was sutured to the pubic tubercle fascia using 0  ethibond. Interrupted 0 ethibonds were then used to suture the mesh to the inferior shelving edge and to the internal oblique superiorly. The tails of the mesh were wrapped around the spermatic cord, ensuring adequate room for the cord, and sutured to each other with 0 ethibond, and then directed laterally to lie flat beneath the external oblique aponeurosis. Hemostasis was ensured within the wound. The Penrose was removed. The external oblique aponeurosis was reapproximated with a running 3-0 Vicryl to re-create a narrowed external ring. More local was infiltrated around the pubic tubercle and in the plane just below the external oblique. The Scarpa's was reapproximated with interrupted 3-0 Vicryls. The skin was closed with a running subcuticular Monocryl. The remainder of the local was injected in the subcutaneous and subcuticular space. The field was then cleaned, benzoin and Steri-Strips and sterile bandage were applied. Both testicles were palpated in the scrotum at the end of the case. The patient was then awakened extubated and taken to PACU in stable condition.    All counts were correct at the completion of the case

## 2022-01-25 NOTE — Anesthesia Preprocedure Evaluation (Signed)
Anesthesia Evaluation  Patient identified by MRN, date of birth, ID band Patient awake    Reviewed: Allergy & Precautions, NPO status , Patient's Chart, lab work & pertinent test results  History of Anesthesia Complications (+) PONV and history of anesthetic complications  Airway Mallampati: II  TM Distance: >3 FB Neck ROM: Full    Dental no notable dental hx.    Pulmonary neg pulmonary ROS,    Pulmonary exam normal breath sounds clear to auscultation       Cardiovascular negative cardio ROS Normal cardiovascular exam Rhythm:Regular Rate:Normal     Neuro/Psych negative neurological ROS  negative psych ROS   GI/Hepatic negative GI ROS, Neg liver ROS,   Endo/Other  negative endocrine ROS  Renal/GU negative Renal ROS  negative genitourinary   Musculoskeletal negative musculoskeletal ROS (+)   Abdominal   Peds negative pediatric ROS (+)  Hematology negative hematology ROS (+)   Anesthesia Other Findings   Reproductive/Obstetrics negative OB ROS                             Anesthesia Physical Anesthesia Plan  ASA: 2  Anesthesia Plan: General   Post-op Pain Management:    Induction: Intravenous  PONV Risk Score and Plan: 3 and Ondansetron, Dexamethasone, Midazolam, Treatment may vary due to age or medical condition and Droperidol  Airway Management Planned: LMA  Additional Equipment:   Intra-op Plan:   Post-operative Plan: Extubation in OR  Informed Consent: I have reviewed the patients History and Physical, chart, labs and discussed the procedure including the risks, benefits and alternatives for the proposed anesthesia with the patient or authorized representative who has indicated his/her understanding and acceptance.     Dental advisory given  Plan Discussed with: CRNA and Surgeon  Anesthesia Plan Comments:         Anesthesia Quick Evaluation

## 2022-01-25 NOTE — Interval H&P Note (Signed)
History and Physical Interval Note:  01/25/2022 7:50 AM  Robert Blake  has presented today for surgery, with the diagnosis of INGUINAL HERNIA.  The various methods of treatment have been discussed with the patient and family. After consideration of risks, benefits and other options for treatment, the patient has consented to  Procedure(s): OPEN RIGHT INGUINAL HERNIA REPAIR (Right) as a surgical intervention.  The patient's history has been reviewed, patient examined, no change in status, stable for surgery.  I have reviewed the patient's chart and labs.  Questions were answered to the patient's satisfaction.     Amedeo Detweiler Rich Brave

## 2022-01-25 NOTE — Discharge Instructions (Addendum)
HERNIA REPAIR: POST OP INSTRUCTIONS   EAT Gradually transition to a high fiber diet with a fiber supplement over the next few weeks after discharge.  Start with a pureed / full liquid diet (see below)  WALK Walk an hour a day (cumulative- not all at once).  Control your pain to do that.    CONTROL PAIN Control pain so that you can walk, sleep, tolerate sneezing/coughing, and go up/down stairs.  HAVE A BOWEL MOVEMENT DAILY Keep your bowels regular to avoid problems.  OK to try a laxative to override constipation.  OK to use an antidairrheal to slow down diarrhea.  Call if not better after 2 tries  CALL IF YOU HAVE PROBLEMS/CONCERNS Call if you are still struggling despite following these instructions. Call if you have concerns not answered by these instructions  ######################################################################    DIET: Follow a light bland diet & liquids the first 24 hours after arrival home, such as soup, liquids, starches, etc.  Be sure to drink plenty of fluids.  Quickly advance to a usual solid diet within a few days.  Avoid fast food or heavy meals as your are more likely to get nauseated or have irregular bowels.  A low-sugar, high-fiber diet for the rest of your life is ideal.   Take your usually prescribed home medications unless otherwise directed.  PAIN CONTROL: Pain is best controlled by a usual combination of three different methods TOGETHER: Ice/Heat Over the counter pain medication Prescription pain medication Most patients will experience some swelling and bruising around the hernia(s) such as the bellybutton, groins, or old incisions.  Ice packs or heating pads (30-60 minutes up to 6 times a day) will help. Use ice for the first few days to help decrease swelling and bruising, then switch to heat to help relax tight/sore spots and speed recovery.  Some people prefer to use ice alone, heat alone, alternating between ice & heat.  Experiment to what  works for you.  Swelling and bruising can take several weeks to resolve.   It is helpful to take an over-the-counter pain medication regularly for the first days: Naproxen (Aleve, etc)  Two 236m tabs twice a day OR Ibuprofen (Advil, etc) Three 2048mtabs four times a day (every meal & bedtime) AND Acetaminophen (Tylenol, etc) 325-65025mour times a day (every meal & bedtime) A  prescription for pain medication should be given to you upon discharge.  Take your pain medication as prescribed, IF NEEDED.  If you are having problems/concerns with the prescription medicine (does not control pain, nausea, vomiting, rash, itching, etc), please call us [phone] see if we need to switch you to a different pain medicine that will work better for you and/or control your side effect better. If you need a refill on your pain medication, please contact your pharmacy.  They will contact our office to request authorization. Prescriptions will not be filled after 5 pm or on week-ends.  Avoid getting constipated.  Between the surgery and the pain medications, it is common to experience some constipation.  Increasing fluid intake and taking a fiber supplement (such as Metamucil, Citrucel, FiberCon, MiraLax, etc) 1-2 times a day regularly will usually help prevent this problem from occurring.  A mild laxative (prune juice, Milk of Magnesia, MiraLax, etc) should be taken according to package directions if there are no bowel movements after 48 hours.    Wash / shower every day, starting 2 days after surgery.  You may shower over the  steri strips which are waterproof.  No rubbing, scrubbing, lotions or ointments to incision. No soaking or swimming.   Remove your outer bandage 2 days after surgery. Steri strips will peel off after 1-2 weeks.  You may leave the incision open to air.  You may replace a dressing/Band-Aid to cover an incision for comfort if you wish.  Continue to shower over incision(s) after the dressing  is off.  ACTIVITIES as tolerated:   You may resume regular (light) daily activities beginning the next day--such as daily self-care, walking, climbing stairs--gradually increasing activities as tolerated.  Control your pain so that you can walk an hour a day.  If you can walk 30 minutes without difficulty, it is safe to try more intense activity such as jogging, treadmill, bicycling, low-impact aerobics, swimming, etc. Refrain from the most intensive and strenuous activity such as sit-ups, heavy lifting, contact sports, etc  Refrain from any heavy lifting or straining until 6 weeks after surgery.   DO NOT PUSH THROUGH PAIN.  Let pain be your guide: If it hurts to do something, don't do it.  Pain is your body warning you to avoid that activity for another week until the pain goes down. You may drive when you are no longer taking prescription pain medication, you can comfortably wear a seatbelt, and you can safely maneuver your car and apply brakes. You may have sexual intercourse when it is comfortable.   FOLLOW UP in our office Please call [facility] at [phone] to set up an appointment to see your surgeon in the office for a follow-up appointment approximately 2-3 weeks after your surgery. Make sure that you call for this appointment the day you arrive home to insure a convenient appointment time.  9.  If you have disability of FMLA / Family leave forms, please bring the forms to the office for processing.  (do not give to your surgeon).  WHEN TO CALL us [phone]: Poor pain control Reactions / problems with new medications (rash/itching, nausea, etc)  Fever over 101.5 F (38.5 C) Inability to urinate Nausea and/or vomiting Worsening swelling or bruising Continued bleeding from incision. Increased pain, redness, or drainage from the incision   The clinic staff is available to answer your questions during regular business hours (8:30am-5pm).  Please dont hesitate to call and ask to speak  to one of our nurses for clinical concerns.   If you have a medical emergency, go to the nearest emergency room or call 911.  A surgeon from [facility] is always on call at the hospitals in [facility], [place], [place], [place], [place]  [address] ?  [address] [address], [place], [place]   [address] MAIN: [phone] ? TOLL FREE: [phone] ? FAX: [phone] [email or web address]   May take Tylenol after 1:00 pm if needed   Post Anesthesia Home Care Instructions  Activity: Get plenty of rest for the remainder of the day. A responsible individual must stay with you for 24 hours following the procedure.  For the next 24 hours, DO NOT: -Drive a car -Paediatric nurse -Drink alcoholic beverages -Take any medication unless instructed by your physician -Make any legal decisions or sign important papers.  Meals: Start with liquid foods such as gelatin or soup. Progress to regular foods as tolerated. Avoid greasy, spicy, heavy foods. If nausea and/or vomiting occur, drink only clear liquids until the nausea and/or vomiting subsides. Call your physician if vomiting continues.  Special  Instructions/Symptoms: Your throat may feel dry or sore from the anesthesia or the breathing tube placed in your throat during surgery. If this causes discomfort, gargle with warm salt water. The discomfort should disappear within 24 hours.  If you had a scopolamine patch placed behind your ear for the management of post- operative nausea and/or vomiting:  1. The medication in the patch is effective for 72 hours, after which it should be removed.  Wrap patch in a tissue and discard in the trash. Wash hands thoroughly with soap and water. 2. You may remove the patch earlier than 72 hours if you experience unpleasant side effects which may include dry mouth, dizziness or visual disturbances. 3. Avoid touching the patch. Wash your hands with soap  and water after contact with the patch.      Information for Discharge Teaching: EXPAREL (bupivacaine liposome injectable suspension)   Your surgeon or anesthesiologist gave you EXPAREL(bupivacaine) to help control your pain after surgery.  EXPAREL is a local anesthetic that provides pain relief by numbing the tissue around the surgical site. EXPAREL is designed to release pain medication over time and can control pain for up to 72 hours. Depending on how you respond to EXPAREL, you may require less pain medication during your recovery.  Possible side effects: Temporary loss of sensation or ability to move in the area where bupivacaine was injected. Nausea, vomiting, constipation Rarely, numbness and tingling in your mouth or lips, lightheadedness, or anxiety may occur. Call your doctor right away if you think you may be experiencing any of these sensations, or if you have other questions regarding possible side effects.  Follow all other discharge instructions given to you by your surgeon or nurse. Eat a healthy diet and drink plenty of water or other fluids.  If you return to the hospital for any reason within 96 hours following the administration of EXPAREL, it is important for health care providers to know that you have received this anesthetic. A teal colored band has been placed on your arm with the date, time and amount of EXPAREL you have received in order to alert and inform your health care providers. Please leave this armband in place for the full 96 hours following administration, and then you may remove the band.

## 2022-01-25 NOTE — Transfer of Care (Signed)
Immediate Anesthesia Transfer of Care Note  Patient: Robert Blake  Procedure(s) Performed: Procedure(s) (LRB): OPEN RIGHT INGUINAL HERNIA REPAIR with mesh (Right)  Patient Location: PACU  Anesthesia Type: General  Level of Consciousness: awake, oriented, sedated and patient cooperative  Airway & Oxygen Therapy: Patient Spontanous Breathing and Patient connected to face mask oxygen  Post-op Assessment: Report given to PACU RN and Post -op Vital signs reviewed and stable  Post vital signs: Reviewed and stable  Complications: No apparent anesthesia complications  Last Vitals:  Vitals Value Taken Time  BP 124/80 01/25/22 1004  Temp    Pulse 75 01/25/22 1005  Resp 13 01/25/22 1005  SpO2 93 % 01/25/22 1005  Vitals shown include unvalidated device data.  Last Pain:  Vitals:   01/25/22 0653  TempSrc: Oral  PainSc: 0-No pain         Complications: No notable events documented.

## 2022-01-26 ENCOUNTER — Encounter (HOSPITAL_BASED_OUTPATIENT_CLINIC_OR_DEPARTMENT_OTHER): Payer: Self-pay | Admitting: Surgery

## 2023-10-01 ENCOUNTER — Emergency Department (HOSPITAL_BASED_OUTPATIENT_CLINIC_OR_DEPARTMENT_OTHER)
Admission: EM | Admit: 2023-10-01 | Discharge: 2023-10-01 | Disposition: A | Discharge status: Home / Self Care | Payer: 59

## 2023-10-01 ENCOUNTER — Emergency Department (HOSPITAL_BASED_OUTPATIENT_CLINIC_OR_DEPARTMENT_OTHER): Payer: 59

## 2023-10-01 ENCOUNTER — Encounter (HOSPITAL_BASED_OUTPATIENT_CLINIC_OR_DEPARTMENT_OTHER): Payer: Self-pay

## 2023-10-01 ENCOUNTER — Other Ambulatory Visit: Payer: Self-pay

## 2023-10-01 DIAGNOSIS — W230XXA Caught, crushed, jammed, or pinched between moving objects, initial encounter: Secondary | ICD-10-CM | POA: Diagnosis not present

## 2023-10-01 DIAGNOSIS — S6992XA Unspecified injury of left wrist, hand and finger(s), initial encounter: Secondary | ICD-10-CM | POA: Diagnosis present

## 2023-10-01 DIAGNOSIS — S60042A Contusion of left ring finger without damage to nail, initial encounter: Secondary | ICD-10-CM | POA: Diagnosis not present

## 2023-10-01 MED ORDER — ACETAMINOPHEN 500 MG PO TABS
1000.0000 mg | ORAL_TABLET | Freq: Once | ORAL | Status: AC
  Administered 2023-10-01: 1000 mg via ORAL
  Filled 2023-10-01: qty 2

## 2023-10-01 NOTE — ED Provider Notes (Signed)
[name] EMERGENCY DEPARTMENT AT [facility] Provider Note   CSN: [id number] Arrival date & time: 10/01/23  1906     History  Chief Complaint  Patient presents with   Finger Injury    L ring    Robert Blake is a 57 y.o. male who presents to ED concerned for crush injury to left ring finger. Patient was working in his shed when finger was crushed in between large tool. Patient was initially managing symptoms at home, but became concerned because finger felt a little cold.   HPI     Home Medications Prior to Admission medications   Medication Sig Start Date End Date Taking? Authorizing Provider  cetirizine (ZYRTEC) 10 MG tablet Take 10 mg by mouth at bedtime.    [provider]  fluticasone (FLONASE) 50 MCG/ACT nasal spray Place 2 sprays into both nostrils at bedtime.    [provider]  loratadine (CLARITIN) 10 MG tablet Take 10 mg by mouth daily.    [provider]  pseudoephedrine (SUDAFED) 30 MG tablet Take by mouth at bedtime. Pt not sure dosage    [provider]  UNABLE TO FIND Otc sinus headache medication combination prn    [provider]      Allergies    Patient has no known allergies.    Review of Systems   Review of Systems  Musculoskeletal:        Finger pain    Physical Exam Updated Vital Signs BP (!) 156/94 (BP Location: Right Arm)   Pulse 80   Temp 98.6 F (37 C)   Resp 18   SpO2 96%  Physical Exam Vitals and nursing note reviewed.  Constitutional:      General: He is not in acute distress.    Appearance: He is not ill-appearing or toxic-appearing.  HENT:     Head: Normocephalic and atraumatic.  Eyes:     General: No scleral icterus.       Right eye: No discharge.        Left eye: No discharge.     Conjunctiva/sclera: Conjunctivae normal.  Cardiovascular:     Rate and Rhythm: Normal rate.  Pulmonary:     Effort: Pulmonary effort is normal.  Abdominal:     General: Abdomen is  flat.  Musculoskeletal:     Comments: Swelling of the left ring finger past the DIP. Dark bruising of this area. Brisk capillary refill. Sensation to light touch intact. +2 radial pulse. No lacerations.   Skin:    General: Skin is warm and dry.  Neurological:     General: No focal deficit present.     Mental Status: He is alert. Mental status is at baseline.  Psychiatric:        Mood and Affect: Mood normal.        Behavior: Behavior normal.     ED Results / Procedures / Treatments   Labs (all labs ordered are listed, but only abnormal results are displayed) Labs Reviewed - No data to display  EKG None  Radiology DG Hand Complete Left  Result Date: 10/01/2023 CLINICAL DATA:  Injury Pt c/o L ring finger injury/ jam approx 2.5hrs ago. Swelling and bruising to distal ring finger noted. EXAM: LEFT HAND - COMPLETE 3+ VIEW COMPARISON:  None Available. FINDINGS: There is no evidence of fracture or dislocation. There is no evidence of arthropathy or other focal bone abnormality. Mild subcutaneus soft tissue edema of the second digit. No retained radiopaque foreign  body. IMPRESSION: 1. No acute displaced fracture or dislocation. 2. No retained radiopaque foreign body. Electronically Signed   By: Tish Frederickson M.D.   On: 10/01/2023 20:50    Procedures Procedures    Medications Ordered in ED Medications  acetaminophen (TYLENOL) tablet 1,000 mg (1,000 mg Oral Given 10/01/23 2138)    ED Course/ Medical Decision Making/ A&P Clinical Course as of 10/01/23 2158  [facility] Oct 01, 2023  2106 DG Hand Complete Left [TY]    Clinical Course User Index [TY] Coral Spikes, DO                                 Medical Decision Making Amount and/or Complexity of Data Reviewed Radiology: ordered. Decision-making details documented in ED Course.  Risk OTC drugs.   This patient presents to the ED for concern of left ring finger pain, this involves an extensive number of treatment options, and  is a complaint that carries with it a high risk of complications and morbidity.  The differential diagnosis includes hemarthrosis, gout, septic joint, fracture, compartment syndrome   Co morbidities that complicate the patient evaluation  none   Additional history obtained:  Dr. Theodis Blake PCP   Imaging Studies ordered:  I ordered imaging studies including  - left hand Xray: to assess for fracture and dislocation I independently visualized and interpreted imaging Shared findings with patient I agree with the radiologist interpretation   Problem List / ED Course / Critical interventions / Medication management  Patient presents to ED concerned for left ring finger pain after sustaining crush injury at home around 5PM earlier today.  Concerned because he felt like his finger was starting to be numb and cold.  Physical exam and ED with some mild-moderate swelling and bruising of the left ring finger past the DIP.  Brisk capillary refill.  Sensation to light touch intact.  Physical exam is reassuring.  Patient afebrile with stable vitals. X-ray without concern for fracture.  Provided patient with Tylenol and ice to help reduce the swelling.  Provided patient with return precautions.  recommended following up with primary care provider.  Patient verbalized understanding of plan. I have reviewed the patients home medicines and have made adjustments as needed Patient afebrile with stable vitals.   Discharged in good condition.   Ddx these are considered less likely due to history of present illness and physical exam -hemarthrosis: joint without swelling; ROM intact -gout: no warmth or erythema; ROM intact  -septic joint: afebrile; no warmth or erythema; no skin changes; ROM intact  -fracture: xray without concern  -compartment syndrome: area not tense; neurovascularly intact   Social Determinants of Health:  none           Final Clinical Impression(s) / ED Diagnoses Final  diagnoses:  Injury of finger of left hand, initial encounter    Rx / DC Orders ED Discharge Orders     None         Robert Blake, [place] 10/01/23 2159    Coral Spikes, DO 10/01/23 2330

## 2023-10-01 NOTE — Discharge Instructions (Addendum)
It was a pleasure caring for you today.  X-ray was reassuring.  Please follow-up with your primary care provider.  Seek emergency care if experiencing any new or worsening symptoms.  Alternating between 650 mg Tylenol and 400 mg Advil: The best way to alternate taking Acetaminophen (example Tylenol) and Ibuprofen (example Advil/Motrin) is to take them 3 hours apart. For example, if you take ibuprofen at 6 am you can then take Tylenol at 9 am. You can continue this regimen throughout the day, making sure you do not exceed the recommended maximum dose for each drug.

## 2023-10-01 NOTE — ED Triage Notes (Signed)
Pt c/o L ring finger injury/ jam approx 2.5hrs ago. Swelling to distal ring finger noted. +ROM in digit, no meds PTA
